# Patient Record
Sex: Male | Born: 1985 | Race: White | Hispanic: Yes | Smoking: Never smoker
Health system: Southern US, Community
[De-identification: ages and names within clinical notes are randomized; demographics above are authoritative.]

## PROBLEM LIST (undated history)

## (undated) DIAGNOSIS — I10 Essential (primary) hypertension: Secondary | ICD-10-CM

---

## 2017-10-29 ENCOUNTER — Emergency Department (HOSPITAL_COMMUNITY)
Admission: EM | Admit: 2017-10-29 | Discharge: 2017-10-30 | Disposition: A | Discharge status: Home / Self Care | Payer: Self-pay | Attending: Emergency Medicine | Admitting: Emergency Medicine

## 2017-10-29 ENCOUNTER — Encounter (HOSPITAL_COMMUNITY): Payer: Self-pay | Admitting: Emergency Medicine

## 2017-10-29 DIAGNOSIS — N50819 Testicular pain, unspecified: Secondary | ICD-10-CM | POA: Insufficient documentation

## 2017-10-29 DIAGNOSIS — R1084 Generalized abdominal pain: Secondary | ICD-10-CM

## 2017-10-29 DIAGNOSIS — N451 Epididymitis: Secondary | ICD-10-CM

## 2017-10-29 DIAGNOSIS — I10 Essential (primary) hypertension: Secondary | ICD-10-CM | POA: Insufficient documentation

## 2017-10-29 DIAGNOSIS — N50812 Left testicular pain: Secondary | ICD-10-CM

## 2017-10-29 HISTORY — DX: Essential (primary) hypertension: I10

## 2017-10-29 LAB — URINALYSIS, ROUTINE W REFLEX MICROSCOPIC
Bilirubin Urine: NEGATIVE
Glucose, UA: NEGATIVE mg/dL
HGB URINE DIPSTICK: NEGATIVE
Ketones, ur: NEGATIVE mg/dL
Leukocytes, UA: NEGATIVE
Nitrite: NEGATIVE
Protein, ur: 30 mg/dL — AB
SPECIFIC GRAVITY, URINE: 1.017 (ref 1.005–1.030)
pH: 6 (ref 5.0–8.0)

## 2017-10-29 LAB — COMPREHENSIVE METABOLIC PANEL
ALBUMIN: 4.1 g/dL (ref 3.5–5.0)
ALK PHOS: 59 U/L (ref 38–126)
ALT: 38 U/L (ref 0–44)
AST: 30 U/L (ref 15–41)
Anion gap: 15 (ref 5–15)
BUN: 11 mg/dL (ref 6–20)
CALCIUM: 9.5 mg/dL (ref 8.9–10.3)
CO2: 26 mmol/L (ref 22–32)
CREATININE: 1.2 mg/dL (ref 0.61–1.24)
Chloride: 101 mmol/L (ref 98–111)
GFR calc Af Amer: >60 mL/min (ref >60)
GFR calc non Af Amer: >60 mL/min (ref >60)
GLUCOSE: 102 mg/dL — AB (ref 70–99)
POTASSIUM: 3.7 mmol/L (ref 3.5–5.1)
Sodium: 142 mmol/L (ref 135–145)
TOTAL PROTEIN: 7.9 g/dL (ref 6.5–8.1)
Total Bilirubin: 0.4 mg/dL (ref 0.3–1.2)

## 2017-10-29 LAB — CBC
HEMATOCRIT: 45 % (ref 39.0–52.0)
Hemoglobin: 15.6 g/dL (ref 13.0–17.0)
MCH: 30.6 pg (ref 26.0–34.0)
MCHC: 34.7 g/dL (ref 30.0–36.0)
MCV: 88.4 fL (ref 78.0–100.0)
Platelets: 338 10*3/uL (ref 150–400)
RBC: 5.09 MIL/uL (ref 4.22–5.81)
RDW: 12.2 % (ref 11.5–15.5)
WBC: 11.1 10*3/uL — ABNORMAL HIGH (ref 4.0–10.5)

## 2017-10-29 LAB — LIPASE, BLOOD: Lipase: 24 U/L (ref 11–51)

## 2017-10-29 NOTE — ED Triage Notes (Signed)
Patient reports intermittent right lateral/mid abdominal pain with emesis and diarrhea onset 3 days ago , pain radiating to ,left testicle , denies dysuria or fever .

## 2017-10-30 ENCOUNTER — Emergency Department (HOSPITAL_COMMUNITY): Payer: Self-pay

## 2017-10-30 MED ORDER — CEFTRIAXONE SODIUM 250 MG IJ SOLR
250.0000 mg | Freq: Once | INTRAMUSCULAR | Status: AC
  Administered 2017-10-30: 250 mg via INTRAMUSCULAR
  Filled 2017-10-30: qty 250

## 2017-10-30 MED ORDER — IBUPROFEN 800 MG PO TABS: 800.0000 mg | ORAL_TABLET | Freq: Three times a day (TID) | ORAL | 0 refills | Status: AC | PRN

## 2017-10-30 MED ORDER — LIDOCAINE HCL (PF) 1 % IJ SOLN
INTRAMUSCULAR | Status: AC
  Filled 2017-10-30: qty 5

## 2017-10-30 MED ORDER — DOXYCYCLINE HYCLATE 100 MG PO CAPS: 100.0000 mg | ORAL_CAPSULE | Freq: Two times a day (BID) | ORAL | 0 refills | Status: AC

## 2017-10-30 NOTE — ED Notes (Signed)
Patient transported to Ultrasound 

## 2017-10-30 NOTE — Discharge Instructions (Addendum)
Regrese aqu segn sea necesario. Seguimiento con el mdico provisto. Es menos probable que tenga una infeccin del epiddimo que est causando Radiographer, therapeuticel dolor testicular. Esto tambin puede referir dolor a su abdomen.  Return here as needed.  Follow-up with the doctor provided.  You have less likely an infection of the epididymis that is causing the testicle pain.  This can refer pain into your abdomen as well.

## 2017-10-30 NOTE — ED Notes (Addendum)
Discharge instructions reviewed via spanish phone interpreter.

## 2017-10-30 NOTE — ED Provider Notes (Signed)
[facility]   CSN: [id number] Arrival date & time: 10/29/17  2047     History   Chief Complaint Chief Complaint  Patient presents with  . Abdominal Pain    [facility] Sande RivesMartinez-Gonzalez is a 32 y.o. male.  HPI Patient presents to the emergency department with abdominal discomfort with left testicular pain.  The patient states this started 3 days ago.  The patient states that he did have some nausea and vomiting.  Patient states there is no palpable abdominal pain the pain is mostly in the testicle but feels discomfort in the abdomen.  The patient states that nothing seems to make the condition better or worse.  The patient denies chest pain, shortness of breath, headache,blurred vision, neck pain, fever, cough, weakness, numbness, dizziness, anorexia, edema, , diarrhea, rash, back pain, dysuria, hematemesis, bloody stool, near syncope, or syncope. Past Medical History:  Diagnosis Date  . Hypertension     There are no active problems to display for this patient.   History reviewed. No pertinent surgical history.      Home Medications    Prior to Admission medications   Not on File    Family History No family history on file.  Social History Social History   Tobacco Use  . Smoking status: Never Smoker  . Smokeless tobacco: Never Used  Substance Use Topics  . Alcohol use: Yes  . Drug use: Never     Allergies   Patient has no known allergies.   Review of Systems Review of Systems All other systems negative except as documented in the HPI. All pertinent positives and negatives as reviewed in the HPI.  Physical Exam Updated Vital Signs BP 118/83 (BP Location: Right Arm)   Pulse 80   Temp 98.5 F (36.9 C) (Oral)   Resp 14   SpO2 98%   Physical Exam  Constitutional: He is oriented to person, place, and time. He appears well-developed and well-nourished. No distress.  HENT:  Head:  Normocephalic and atraumatic.  Mouth/Throat: Oropharynx is clear and moist.  Eyes: Pupils are equal, round, and reactive to light.  Neck: Normal range of motion. Neck supple.  Cardiovascular: Normal rate, regular rhythm and normal heart sounds. Exam reveals no gallop and no friction rub.  No murmur heard. Pulmonary/Chest: Effort normal and breath sounds normal. No respiratory distress. He has no wheezes.  Abdominal: Soft. Bowel sounds are normal. He exhibits no distension. There is no tenderness.  Genitourinary:     Neurological: He is alert and oriented to person, place, and time. He exhibits normal muscle tone. Coordination normal.  Skin: Skin is warm and dry. Capillary refill takes less than 2 seconds. No rash noted. No erythema.  Psychiatric: He has a normal mood and affect. His behavior is normal.  Nursing note and vitals reviewed.    ED Treatments / Results  Labs (all labs ordered are listed, but only abnormal results are displayed) Labs Reviewed  COMPREHENSIVE METABOLIC PANEL - Abnormal; Notable for the following components:      Result Value   Glucose, Bld 102 (*)    All other components within normal limits  CBC - Abnormal; Notable for the following components:   WBC 11.1 (*)    All other components within normal limits  URINALYSIS, ROUTINE W REFLEX MICROSCOPIC - Abnormal; Notable for the following components:   Protein, ur 30 (*)    Bacteria, UA RARE (*)    All other components within  normal limits  LIPASE, BLOOD    EKG None  Radiology No results found.  Procedures Procedures (including critical care time)  Medications Ordered in ED Medications - No data to display   Initial Impression / Assessment and Plan / ED Course  I have reviewed the triage vital signs and the nursing notes.  Pertinent labs & imaging results that were available during my care of the patient were reviewed by me and considered in my medical decision making (see chart for  details).    The patient's epididymis on the left is tender on examination and I feel that we should treat for epididymitis even though the ultrasound does not show any signs of epididymal infection or swelling.  Patient is advised to return here as needed.  Will give follow-up with urology as needed.  Final Clinical Impressions(s) / ED Diagnoses   Final diagnoses:  Pain in left testicle    ED Discharge Orders    None       Charlestine Night, PA-C 10/30/17 1610    Devoria Albe, MD 10/30/17 [phone]

## 2019-07-13 IMAGING — US US SCROTUM W/ DOPPLER COMPLETE
1 series · 13 of 25 positions shown · non-contrast
Comparison: None.

CLINICAL DATA: Left scrotal pain.

EXAM:
SCROTAL ULTRASOUND
DOPPLER ULTRASOUND OF THE TESTICLES
TECHNIQUE: Complete ultrasound examination of the testicles, epididymis, and
other scrotal structures was performed. Color and spectral Doppler
ultrasound were also utilized to evaluate blood flow to the
testicles.

[Series 1: us scrotum w/ doppler complete · 0.07mm/px · 13 of 53 slices shown]
[im 1/53]
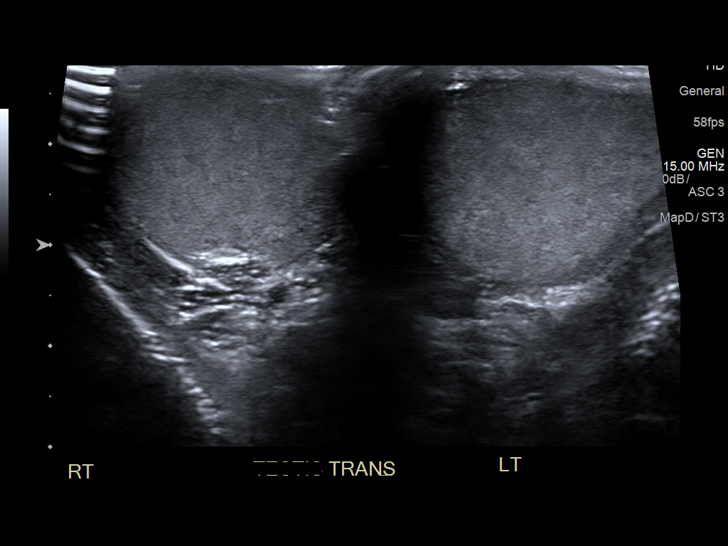
[im 5/53]
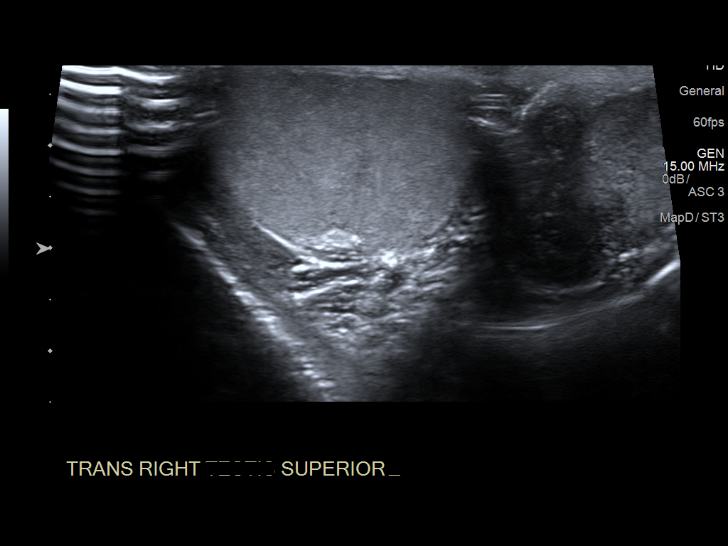
[im 9/53]
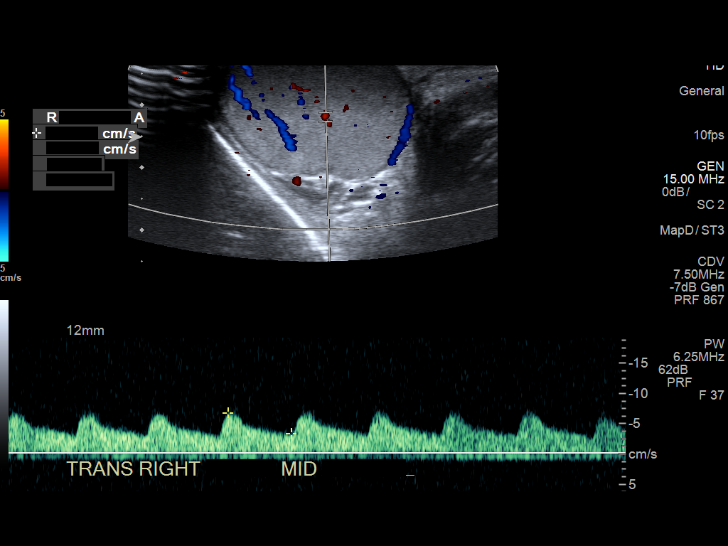
[im 14/53]
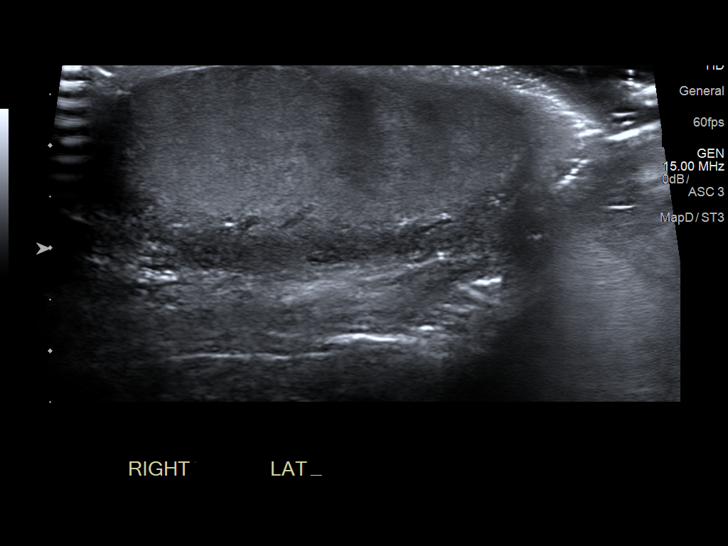
[im 18/53]
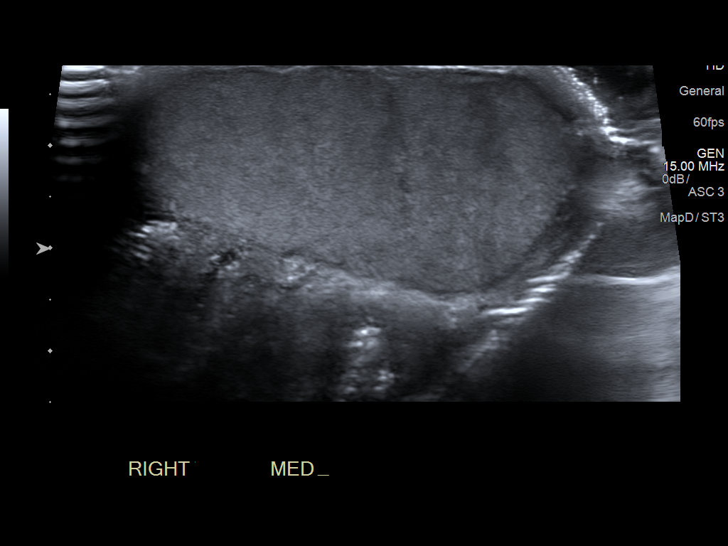
[im 22/53]
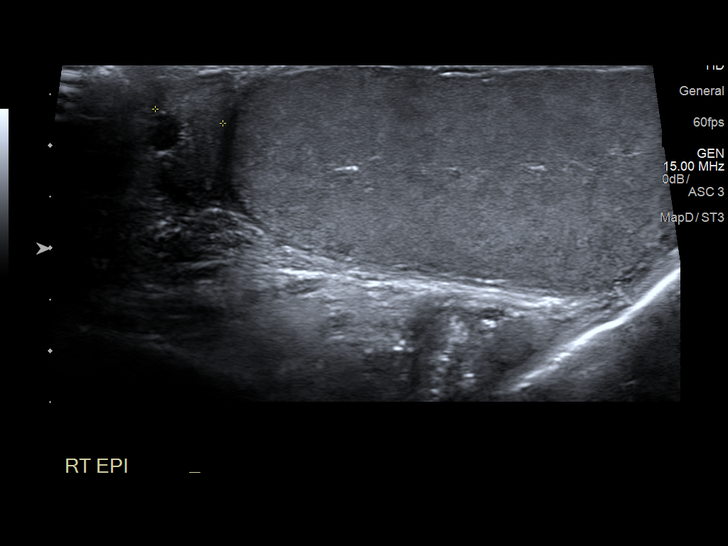
[im 27/53]
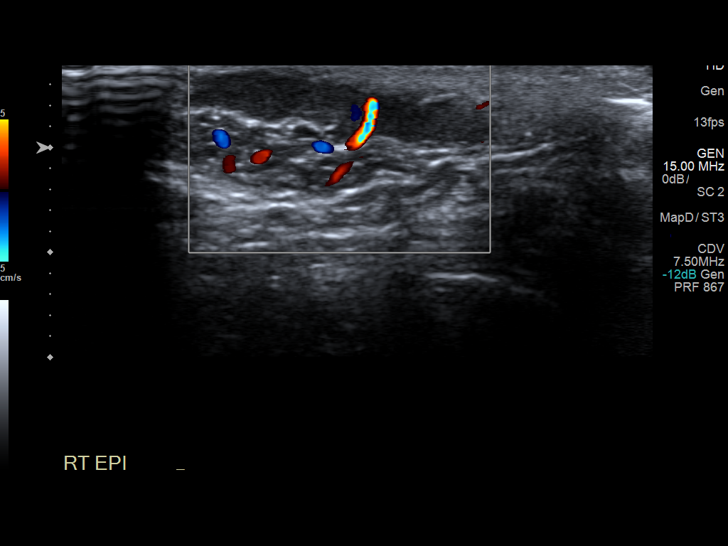
[im 31/53]
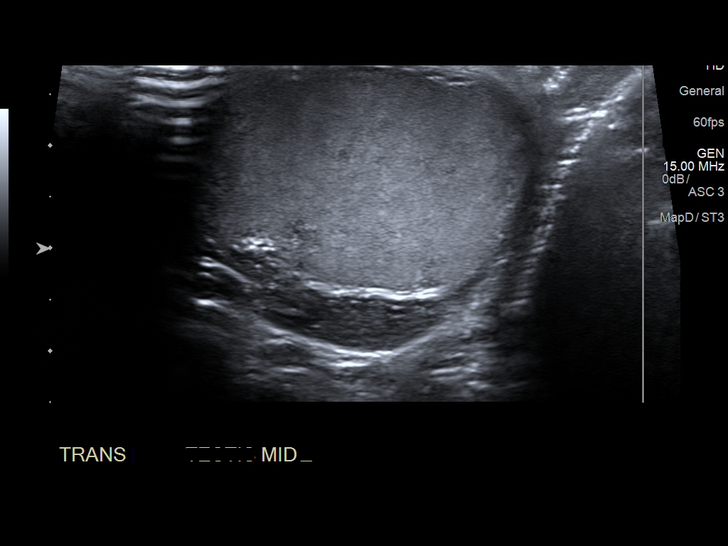
[im 35/53]
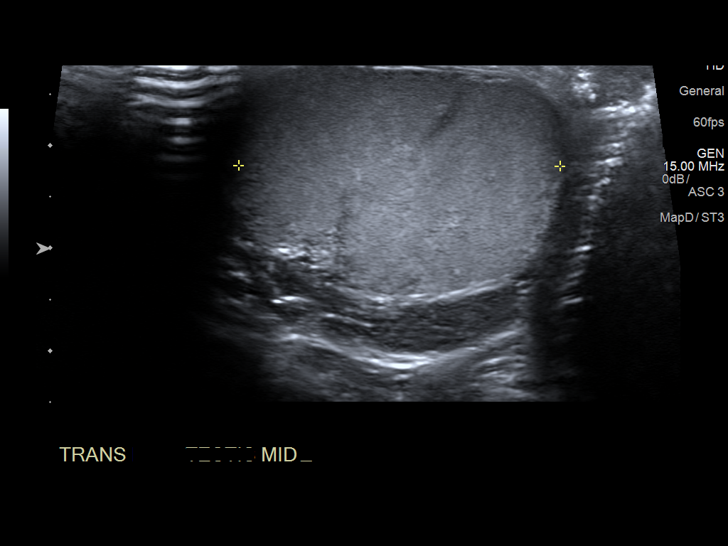
[im 40/53]
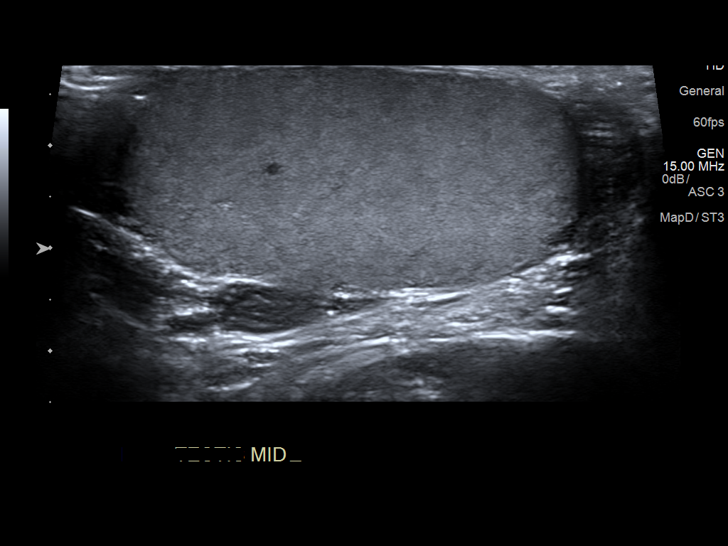
[im 44/53]
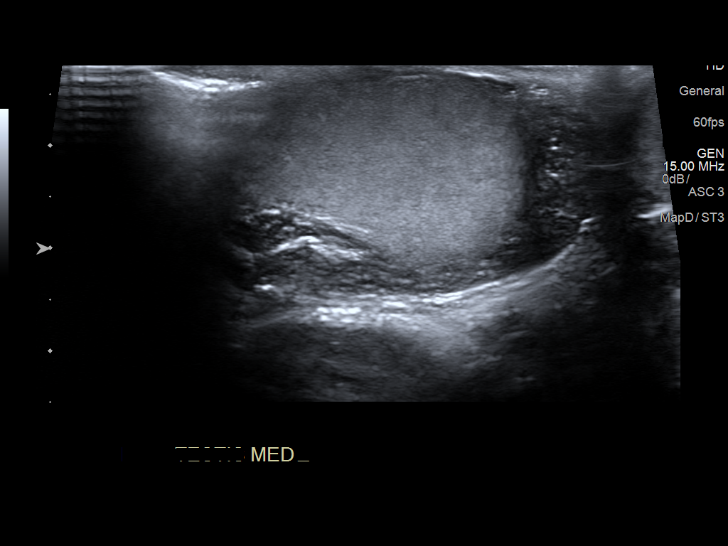
[im 48/53]
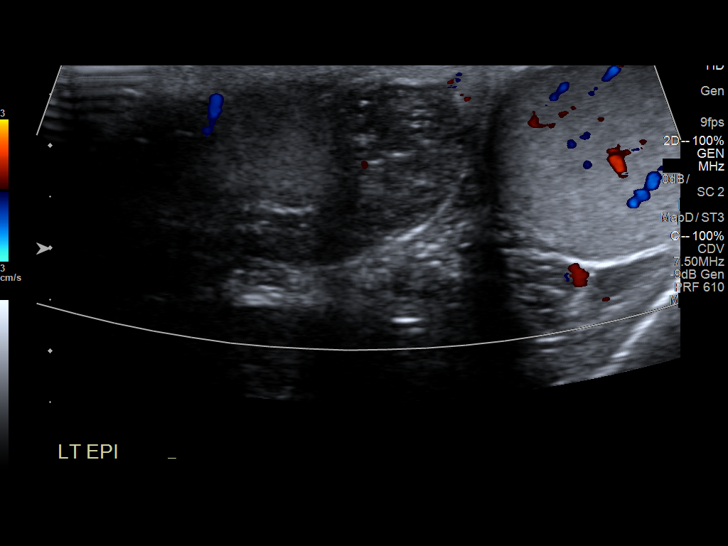
[im 53/53]
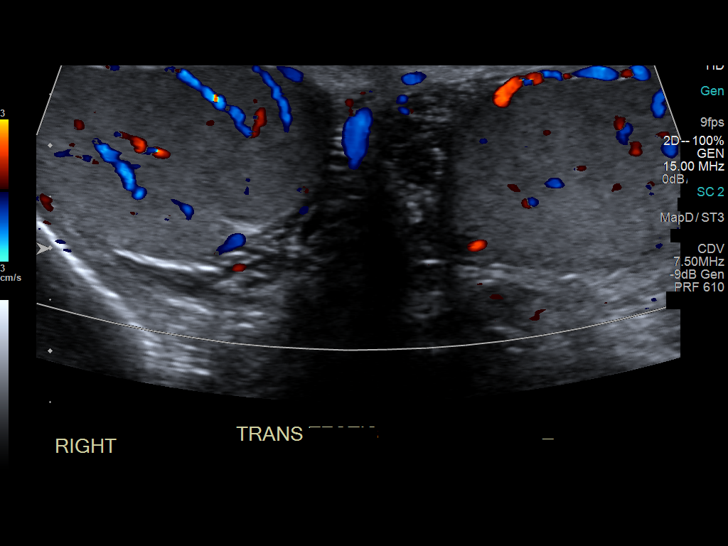

[13 of 25 positions shown; findings below may reference images not displayed]

FINDINGS: Right testicle

Measurements: 4.6 x 2.1 x 2.9 cm. No mass or microlithiasis
visualized.

Left testicle

Measurements: 4.3 x 2.3 x 3.2 cm. No mass or microlithiasis
visualized.

Right epididymis: Simple cysts versus spermatoceles in the right
epididymal head measuring 3 x 4 x 3 mm and 4 x 4 x 4 mm.

Left epididymis: Mild tubular ectasia in the left epididymis without
left epididymal hyperemia on color Doppler.

Hydrocele:  No significant hydrocele.

Varicocele:  None visualized.

Pulsed Doppler interrogation of both testes demonstrates normal low
resistance arterial and venous waveforms bilaterally.
IMPRESSION: 1. No evidence of testicular torsion.  No testicular mass.
2. Mild tubular ectasia in the left epididymis. No evidence of acute
epididymitis.
3. Tiny simple cysts versus spermatoceles in the right epididymal
head.

## 2020-11-24 ENCOUNTER — Other Ambulatory Visit: Payer: Self-pay | Admitting: Nurse Practitioner

## 2022-03-21 ENCOUNTER — Ambulatory Visit: Payer: Self-pay | Admitting: Internal Medicine
